# Patient Record
Sex: Female | Born: 1990 | Race: White | Hispanic: No | Marital: Single | Smoking: Never smoker
Health system: Southern US, Community
[De-identification: ages and names within clinical notes are randomized; demographics above are authoritative.]

## PROBLEM LIST (undated history)

## (undated) DIAGNOSIS — J45909 Unspecified asthma, uncomplicated: Secondary | ICD-10-CM

---
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
## 2015-07-25 ENCOUNTER — Encounter (HOSPITAL_COMMUNITY): Payer: Self-pay | Admitting: Emergency Medicine

## 2015-07-25 ENCOUNTER — Emergency Department (HOSPITAL_COMMUNITY): Payer: BLUE CROSS/BLUE SHIELD

## 2015-07-25 ENCOUNTER — Emergency Department (HOSPITAL_COMMUNITY)
Admission: EM | Admit: 2015-07-25 | Discharge: 2015-07-25 | Disposition: A | Discharge status: Home / Self Care | Payer: BLUE CROSS/BLUE SHIELD | Attending: Emergency Medicine | Admitting: Emergency Medicine

## 2015-07-25 DIAGNOSIS — S0003XA Contusion of scalp, initial encounter: Secondary | ICD-10-CM | POA: Diagnosis not present

## 2015-07-25 DIAGNOSIS — S199XXA Unspecified injury of neck, initial encounter: Secondary | ICD-10-CM | POA: Diagnosis present

## 2015-07-25 DIAGNOSIS — S134XXA Sprain of ligaments of cervical spine, initial encounter: Secondary | ICD-10-CM | POA: Diagnosis not present

## 2015-07-25 DIAGNOSIS — Y9389 Activity, other specified: Secondary | ICD-10-CM | POA: Diagnosis not present

## 2015-07-25 DIAGNOSIS — Z79899 Other long term (current) drug therapy: Secondary | ICD-10-CM | POA: Insufficient documentation

## 2015-07-25 DIAGNOSIS — Y92410 Unspecified street and highway as the place of occurrence of the external cause: Secondary | ICD-10-CM | POA: Insufficient documentation

## 2015-07-25 DIAGNOSIS — Y998 Other external cause status: Secondary | ICD-10-CM | POA: Insufficient documentation

## 2015-07-25 DIAGNOSIS — S139XXA Sprain of joints and ligaments of unspecified parts of neck, initial encounter: Secondary | ICD-10-CM

## 2015-07-25 DIAGNOSIS — S0001XA Abrasion of scalp, initial encounter: Secondary | ICD-10-CM | POA: Insufficient documentation

## 2015-07-25 MED ORDER — IBUPROFEN 800 MG PO TABS: 800.0000 mg | ORAL_TABLET | Freq: Three times a day (TID) | ORAL | Status: AC

## 2015-07-25 MED ORDER — CYCLOBENZAPRINE HCL 10 MG PO TABS: 10.0000 mg | ORAL_TABLET | Freq: Two times a day (BID) | ORAL | Status: AC | PRN
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
## 2015-07-25 NOTE — Discharge Instructions (Signed)
Cervical Sprain A cervical sprain is an injury in the neck in which the strong, fibrous tissues (ligaments) that connect your neck bones stretch or tear. Cervical sprains can range from mild to severe. Severe cervical sprains can cause the neck vertebrae to be unstable. This can lead to damage of the spinal cord and can result in serious nervous system problems. The amount of time it takes for a cervical sprain to get better depends on the cause and extent of the injury. Most cervical sprains heal in 1 to 3 weeks. CAUSES  Severe cervical sprains may be caused by:   Contact sport injuries (such as from football, rugby, wrestling, hockey, auto racing, gymnastics, diving, martial arts, or boxing).   Motor vehicle collisions.   Whiplash injuries. This is an injury from a sudden forward and backward whipping movement of the head and neck.  Falls.  Mild cervical sprains may be caused by:   Being in an awkward position, such as while cradling a telephone between your ear and shoulder.   Sitting in a chair that does not offer proper support.   Working at a poorly Landscape architect station.   Looking up or down for long periods of time.  SYMPTOMS   Pain, soreness, stiffness, or a burning sensation in the front, back, or sides of the neck. This discomfort may develop immediately after the injury or slowly, 24 hours or more after the injury.   Pain or tenderness directly in the middle of the back of the neck.   Shoulder or upper back pain.   Limited ability to move the neck.   Headache.   Dizziness.   Weakness, numbness, or tingling in the hands or arms.   Muscle spasms.   Difficulty swallowing or chewing.   Tenderness and swelling of the neck.  DIAGNOSIS  Most of the time your health care provider can diagnose a cervical sprain by taking your history and doing a physical exam. Your health care provider will ask about previous neck injuries and any known neck  problems, such as arthritis in the neck. X-rays may be taken to find out if there are any other problems, such as with the bones of the neck. Other tests, such as a CT scan or MRI, may also be needed.  TREATMENT  Treatment depends on the severity of the cervical sprain. Mild sprains can be treated with rest, keeping the neck in place (immobilization), and pain medicines. Severe cervical sprains are immediately immobilized. Further treatment is done to help with pain, muscle spasms, and other symptoms and may include:  Medicines, such as pain relievers, numbing medicines, or muscle relaxants.   Physical therapy. This may involve stretching exercises, strengthening exercises, and posture training. Exercises and improved posture can help stabilize the neck, strengthen muscles, and help stop symptoms from returning.  HOME CARE INSTRUCTIONS   Put ice on the injured area.   Put ice in a plastic bag.   Place a towel between your skin and the bag.   Leave the ice on for 15-20 minutes, 3-4 times a day.   If your injury was severe, you may have been given a cervical collar to wear. A cervical collar is a two-piece collar designed to keep your neck from moving while it heals.  Do not remove the collar unless instructed by your health care provider.  If you have long hair, keep it outside of the collar.  Ask your health care provider before making any adjustments to your collar. Minor  adjustments may be required over time to improve comfort and reduce pressure on your chin or on the back of your head.  Ifyou are allowed to remove the collar for cleaning or bathing, follow your health care provider's instructions on how to do so safely.  Keep your collar clean by wiping it with mild soap and water and drying it completely. If the collar you have been given includes removable pads, remove them every 1-2 days and hand wash them with soap and water. Allow them to air dry. They should be completely  dry before you wear them in the collar.  If you are allowed to remove the collar for cleaning and bathing, wash and dry the skin of your neck. Check your skin for irritation or sores. If you see any, tell your health care provider.  Do not drive while wearing the collar.   Only take over-the-counter or prescription medicines for pain, discomfort, or fever as directed by your health care provider.   Keep all follow-up appointments as directed by your health care provider.   Keep all physical therapy appointments as directed by your health care provider.   Make any needed adjustments to your workstation to promote good posture.   Avoid positions and activities that make your symptoms worse.   Warm up and stretch before being active to help prevent problems.  SEEK MEDICAL CARE IF:   Your pain is not controlled with medicine.   You are unable to decrease your pain medicine over time as planned.   Your activity level is not improving as expected.  SEEK IMMEDIATE MEDICAL CARE IF:   You develop any bleeding.  You develop stomach upset.  You have signs of an allergic reaction to your medicine.   Your symptoms get worse.   You develop new, unexplained symptoms.   You have numbness, tingling, weakness, or paralysis in any part of your body.  MAKE SURE YOU:   Understand these instructions.  Will watch your condition.  Will get help right away if you are not doing well or get worse.   This information is not intended to replace advice given to you by your health care provider. Make sure you discuss any questions you have with your health care provider.   Document Released: 04/11/2007 Document Revised: 06/19/2013 Document Reviewed: 12/20/2012 Elsevier Interactive Patient Education 2016 [employer] Cryotherapy Cryotherapy means treatment with cold. Ice or gel packs can be used to reduce both pain and swelling. Ice is the most helpful within the first 24 to 48  hours after an injury or flare-up from overusing a muscle or joint. Sprains, strains, spasms, burning pain, shooting pain, and aches can all be eased with ice. Ice can also be used when recovering from surgery. Ice is effective, has very few side effects, and is safe for most people to use. PRECAUTIONS  Ice is not a safe treatment option for people with:  Raynaud phenomenon. This is a condition affecting small blood vessels in the extremities. Exposure to cold may cause your problems to return.  Cold hypersensitivity. There are many forms of cold hypersensitivity, including:  Cold urticaria. Red, itchy hives appear on the skin when the tissues begin to warm after being iced.  Cold erythema. This is a red, itchy rash caused by exposure to cold.  Cold hemoglobinuria. Red blood cells break down when the tissues begin to warm after being iced. The hemoglobin that carry oxygen are passed into the urine because they cannot combine with blood  proteins fast enough.  Numbness or altered sensitivity in the area being iced. If you have any of the following conditions, do not use ice until you have discussed cryotherapy with your caregiver:  Heart conditions, such as arrhythmia, angina, or chronic heart disease.  High blood pressure.  Healing wounds or open skin in the area being iced.  Current infections.  Rheumatoid arthritis.  Poor circulation.  Diabetes. Ice slows the blood flow in the region it is applied. This is beneficial when trying to stop inflamed tissues from spreading irritating chemicals to surrounding tissues. However, if you expose your skin to cold temperatures for too long or without the proper protection, you can damage your skin or nerves. Watch for signs of skin damage due to cold. HOME CARE INSTRUCTIONS Follow these tips to use ice and cold packs safely.  Place a dry or damp towel between the ice and skin. A damp towel will cool the skin more quickly, so you may need to  shorten the time that the ice is used.  For a more rapid response, add gentle compression to the ice.  Ice for no more than 10 to 20 minutes at a time. The bonier the area you are icing, the less time it will take to get the benefits of ice.  Check your skin after 5 minutes to make sure there are no signs of a poor response to cold or skin damage.  Rest 20 minutes or more between uses.  Once your skin is numb, you can end your treatment. You can test numbness by very lightly touching your skin. The touch should be so light that you do not see the skin dimple from the pressure of your fingertip. When using ice, most people will feel these normal sensations in this order: cold, burning, aching, and numbness.  Do not use ice on someone who cannot communicate their responses to pain, such as small children or people with dementia. HOW TO MAKE AN ICE PACK Ice packs are the most common way to use ice therapy. Other methods include ice massage, ice baths, and cryosprays. Muscle creams that cause a cold, tingly feeling do not offer the same benefits that ice offers and should not be used as a substitute unless recommended by your caregiver. To make an ice pack, do one of the following:  Place crushed ice or a bag of frozen vegetables in a sealable plastic bag. Squeeze out the excess air. Place this bag inside another plastic bag. Slide the bag into a pillowcase or place a damp towel between your skin and the bag.  Mix 3 parts water with 1 part rubbing alcohol. Freeze the mixture in a sealable plastic bag. When you remove the mixture from the freezer, it will be slushy. Squeeze out the excess air. Place this bag inside another plastic bag. Slide the bag into a pillowcase or place a damp towel between your skin and the bag. SEEK MEDICAL CARE IF:  You develop white spots on your skin. This may give the skin a blotchy (mottled) appearance.  Your skin turns blue or pale.  Your skin becomes waxy or  hard.  Your swelling gets worse. MAKE SURE YOU:   Understand these instructions.  Will watch your condition.  Will get help right away if you are not doing well or get worse.   This information is not intended to replace advice given to you by your health care provider. Make sure you discuss any questions you have with your  health care provider.   Document Released: 02/08/2011 Document Revised: 07/05/2014 Document Reviewed: 02/08/2011 Elsevier Interactive Patient Education 2016 [employer]. Tourist information centre manager It is common to have multiple bruises and sore muscles after a motor vehicle collision (MVC). These tend to feel worse for the first 24 hours. You may have the most stiffness and soreness over the first several hours. You may also feel worse when you wake up the first morning after your collision. After this point, you will usually begin to improve with each day. The speed of improvement often depends on the severity of the collision, the number of injuries, and the location and nature of these injuries. HOME CARE INSTRUCTIONS  Put ice on the injured area.  Put ice in a plastic bag.  Place a towel between your skin and the bag.  Leave the ice on for 15-20 minutes, 3-4 times a day, or as directed by your health care provider.  Drink enough fluids to keep your urine clear or pale yellow. Do not drink alcohol.  Take a warm shower or bath once or twice a day. This will increase blood flow to sore muscles.  You may return to activities as directed by your caregiver. Be careful when lifting, as this may aggravate neck or back pain.  Only take over-the-counter or prescription medicines for pain, discomfort, or fever as directed by your caregiver. Do not use aspirin. This may increase bruising and bleeding. SEEK IMMEDIATE MEDICAL CARE IF:  You have numbness, tingling, or weakness in the arms or legs.  You develop severe headaches not relieved with medicine.  You have  severe neck pain, especially tenderness in the middle of the back of your neck.  You have changes in bowel or bladder control.  There is increasing pain in any area of the body.  You have shortness of breath, light-headedness, dizziness, or fainting.  You have chest pain.  You feel sick to your stomach (nauseous), throw up (vomit), or sweat.  You have increasing abdominal discomfort.  There is blood in your urine, stool, or vomit.  You have pain in your shoulder (shoulder strap areas).  You feel your symptoms are getting worse. MAKE SURE YOU:  Understand these instructions.  Will watch your condition.  Will get help right away if you are not doing well or get worse.   This information is not intended to replace advice given to you by your health care provider. Make sure you discuss any questions you have with your health care provider.   Document Released: 06/14/2005 Document Revised: 07/05/2014 Document Reviewed: 11/11/2010 Elsevier Interactive Patient Education [employer]

## 2018-02-06 ENCOUNTER — Emergency Department (HOSPITAL_COMMUNITY)
Admission: EM | Admit: 2018-02-06 | Discharge: 2018-02-06 | Discharge status: Against Medical Advice | Payer: BLUE CROSS/BLUE SHIELD | Attending: Emergency Medicine | Admitting: Emergency Medicine

## 2018-02-06 ENCOUNTER — Emergency Department (HOSPITAL_COMMUNITY): Payer: BLUE CROSS/BLUE SHIELD

## 2018-02-06 ENCOUNTER — Encounter (HOSPITAL_COMMUNITY): Payer: Self-pay | Admitting: *Deleted

## 2018-02-06 DIAGNOSIS — Z79899 Other long term (current) drug therapy: Secondary | ICD-10-CM | POA: Diagnosis not present

## 2018-02-06 DIAGNOSIS — R2689 Other abnormalities of gait and mobility: Secondary | ICD-10-CM | POA: Insufficient documentation

## 2018-02-06 DIAGNOSIS — R51 Headache: Secondary | ICD-10-CM | POA: Diagnosis not present

## 2018-02-06 DIAGNOSIS — Z5321 Procedure and treatment not carried out due to patient leaving prior to being seen by health care provider: Secondary | ICD-10-CM | POA: Insufficient documentation

## 2018-02-06 DIAGNOSIS — H538 Other visual disturbances: Secondary | ICD-10-CM | POA: Diagnosis not present

## 2018-02-06 DIAGNOSIS — J45909 Unspecified asthma, uncomplicated: Secondary | ICD-10-CM | POA: Diagnosis not present

## 2018-02-06 DIAGNOSIS — R519 Headache, unspecified: Secondary | ICD-10-CM

## 2018-02-06 DIAGNOSIS — R0789 Other chest pain: Secondary | ICD-10-CM | POA: Diagnosis not present

## 2018-02-06 HISTORY — DX: Unspecified asthma, uncomplicated: J45.909

## 2018-02-06 LAB — PREGNANCY, URINE: Preg Test, Ur: NEGATIVE

## 2018-02-06 MED ORDER — DEXAMETHASONE SODIUM PHOSPHATE 10 MG/ML IJ SOLN
10.0000 mg | Freq: Once | INTRAMUSCULAR | Status: AC
  Administered 2018-02-06: 10 mg via INTRAVENOUS
  Filled 2018-02-06: qty 1

## 2018-02-06 MED ORDER — METOCLOPRAMIDE HCL 5 MG/ML IJ SOLN
10.0000 mg | Freq: Once | INTRAMUSCULAR | Status: AC
  Administered 2018-02-06: 10 mg via INTRAVENOUS
  Filled 2018-02-06: qty 2

## 2018-02-06 NOTE — ED Notes (Signed)
Pt does not want the CT; MD made aware

## 2018-02-06 NOTE — ED Provider Notes (Signed)
[facility]   CSN: [id number] Arrival date & time: 02/06/18  1753     History   Chief Complaint Chief Complaint  Patient presents with  . Migraine  . Cough    HPI Michelle Cantrell is a 27 y.o. female with asthma who presents with 1 month of severe, constant, frontal headache.  She says that she has no history of headaches or migraines.  She reports taking Advil but says that this has provided minimal relief.  She reports feeling off balance at times.  She also says that her headaches are worse in the morning.  She also reports having some chest tightness, which she attributes to her asthma.  She says that she has been taking her inhaler, which has provided some relief.  She has no history of cancer.  She also reports having intermittent bilateral blurry vision.  HPI  Past Medical History:  Diagnosis Date  . Asthma     There are no active problems to display for this patient.   History reviewed. No pertinent surgical history.   OB History   None      Home Medications    Prior to Admission medications   Medication Sig Start Date End Date Taking? Authorizing Provider  cholecalciferol (VITAMIN D) 1000 units tablet Take 1,000 Units by mouth daily.    [provider]  cyclobenzaprine (FLEXERIL) 10 MG tablet Take 1 tablet (10 mg total) by mouth 2 (two) times daily as needed for muscle spasms. 07/25/15   Elpidio AnisUpstill, Shari, PA-C  ibuprofen (ADVIL,MOTRIN) 800 MG tablet Take 1 tablet (800 mg total) by mouth 3 (three) times daily. 07/25/15   Elpidio AnisUpstill, Shari, PA-C  Omega-3 Fatty Acids (FISH OIL PO) Take 1 capsule by mouth 2 (two) times daily.    [provider]    Family History History reviewed. No pertinent family history.  Social History Social History   Tobacco Use  . Smoking status: Never Smoker  . Smokeless tobacco: Never Used  Substance Use Topics  . Alcohol use: Not on file  . Drug use: Not on file      Allergies   Patient has no known allergies.   Review of Systems Review of Systems Review of Systems   Constitutional  Negative for fever  Negative for chills  HENT  Negative for ear pain  Negative for sore throat  Negative for difficultly swallowing  Eyes  Negative for eye pain  Negative for visual disturbance  Respiratory  +for shortness of breath  Negative for cough  CV  Negative for chest pain  Negative for leg swelling  Abdomen  Negative for abdominal pain  Negative for nausea  Negative for vomiting  MSK  Negative for extremity pain  Negative for back pain  Skin  Negative for rash  Negative for wound  Neuro  Negative for syncope  Negative for difficultly speaking  +headache  Psych  Negative for confusion   The remainder of the ROS was reviewed and negative except as documented above.      Physical Exam Updated Vital Signs BP 102/74   Pulse 74   Temp 98.4 F (36.9 C) (Oral)   Resp 16   LMP 02/06/2018   SpO2 100%   Physical Exam Physical Exam Constitutional  Nursing notes reviewed  Vital signs reviewed  HEENT  No obvious trauma  Supple without meningismus, mass, or overt JVD  EOMI  No scleral icterus or injection  Respiratory  Effort normal  CTAB  No respiratory distress  CV  Normal rate  No obvious murmurs  No pitting edema  Abdomen  Soft  Non-tender  Non-distended  No peritonitis  MSK  Atraumatic  No obvious deformity  ROM appropriate  Skin  Warm  Dry  Neuro Component Findings  Mental Status Exam Alert and oriented Memory appropriate  Cranial Nerves   CN II Visual fields intact to confrontation  CN III, IV, VI PERRL EOMI No nystagmus.   CN V Facial sensation is normal No weakness of masticatory muscles  CN VII No facial weakness or asymmetry  CN VIII Auditory acuity grossly normal  CN IX and X Uvula is midline Palate elevates symmetrically  CN XI  Normal sternocleidomastoid and  trapezius strength  CN XII The tongue is midline No tongue atrophy or fasciculations  Motor   Muscle Strength RUE: 5/5 flexion and extension RLE: 5/5 flexion and extension LUE: 5/5 flexion and extension LLE: 5/5 flexion and extension  No pronation or drift  Muscle Tone Normal bulk and tone  Coordination Intact finger-to-nose No tremor Negative Romberg  Sensation Intact to light touch  Gait Routine gait normal Mild difficultly with heel to toe walking        Psychiatric  Mood and affect normal        ED Treatments / Results  Labs (all labs ordered are listed, but only abnormal results are displayed) Labs Reviewed  PREGNANCY, URINE    EKG None  Radiology Dg Chest 2 View  Result Date: 02/06/2018 CLINICAL DATA:  Left lower chest pain EXAM: CHEST - 2 VIEW COMPARISON:  None. FINDINGS: The heart size and mediastinal contours are within normal limits. Both lungs are clear. The visualized skeletal structures are unremarkable. IMPRESSION: No active cardiopulmonary disease. Electronically Signed   By: Jasmine Pang M.D.   On: 02/06/2018 19:06    Procedures Procedures (including critical care time)  Medications Ordered in ED Medications  metoCLOPramide (REGLAN) injection 10 mg (10 mg Intravenous Given 02/06/18 2114)  dexamethasone (DECADRON) injection 10 mg (10 mg Intravenous Given 02/06/18 2114)     Initial Impression / Assessment and Plan / ED Course  I have reviewed the triage vital signs and the nursing notes.  Pertinent labs & imaging results that were available during my care of the patient were reviewed by me and considered in my medical decision making (see chart for details).     Michelle Cantrell presents due to 1 month of headache.  The differential for this is broad.  I see no signs of an acute infection.  I do not think that she has meningitis or encephalitis.  She has no current vision changes or conjunctival changes to suggest acute angle-closure glaucoma.   Given the nature of her headache, her saying that she feels off balance, and that her headaches are worse in the morning, I am concerned for a possible brain mass.  Idiopathic intracranial hypertension is also on the differential.  There has been no recent trauma to suggest an epidural, subdural, or intraparenchymal bleed.  She has no focal neurological deficits. She reports having some chest tightness, which she says is like her asthma.  She is currently CTAB.  I explained the work-up to the patient.  She was given Reglan and Decadron.  I also explained to her that we would like to obtain a CT scan.  The patient refused.  She said that she only wanted to go home.  She had decision-making capacity.  I explained to  her that I was concerned for a possible brain mass and that a CT scan was critical for helping with her evaluation.  She understood the risk and benefits of staying for further care and of being discharged.  She was able to rationally manipulate the information.  I believe that she has the capacity to refuse care.  She left AMA.  I provided ED return precautions.  Final Clinical Impressions(s) / ED Diagnoses   Final diagnoses:  Nonintractable headache, unspecified chronicity pattern, unspecified headache type    ED Discharge Orders    None       Talitha GivensAshburn, Nick, MD 02/07/18 0100    Margarita Grizzleay, Danielle, MD 02/09/18 1459

## 2018-02-06 NOTE — Discharge Instructions (Addendum)
You are leaving AMA.  We recommended further evaluation and a head Ct.  Please followup with your PCP.

## 2018-02-06 NOTE — ED Notes (Signed)
MD at bedside. 

## 2018-02-06 NOTE — ED Notes (Addendum)
Pt appears very anxious, attempted to encourage pt to stay longer, pt refusing. No signature pad available to sign out AMA; papercopy printed and explained to pt benefits and risks of leaving. papercopy signed by pt and witnessed signature by this RN, placed in medical records. Work note provided

## 2018-02-06 NOTE — ED Triage Notes (Signed)
Pt in c/o migraine for the last month, also a cough and increased use of her albuterol inhaler, no distress noted

## 2019-02-01 IMAGING — DX DG CHEST 2V
2 series · 2 of 2 positions shown · non-contrast
Comparison: None.

CLINICAL DATA: Left lower chest pain

EXAM:
CHEST - 2 VIEW

[w chest pa]
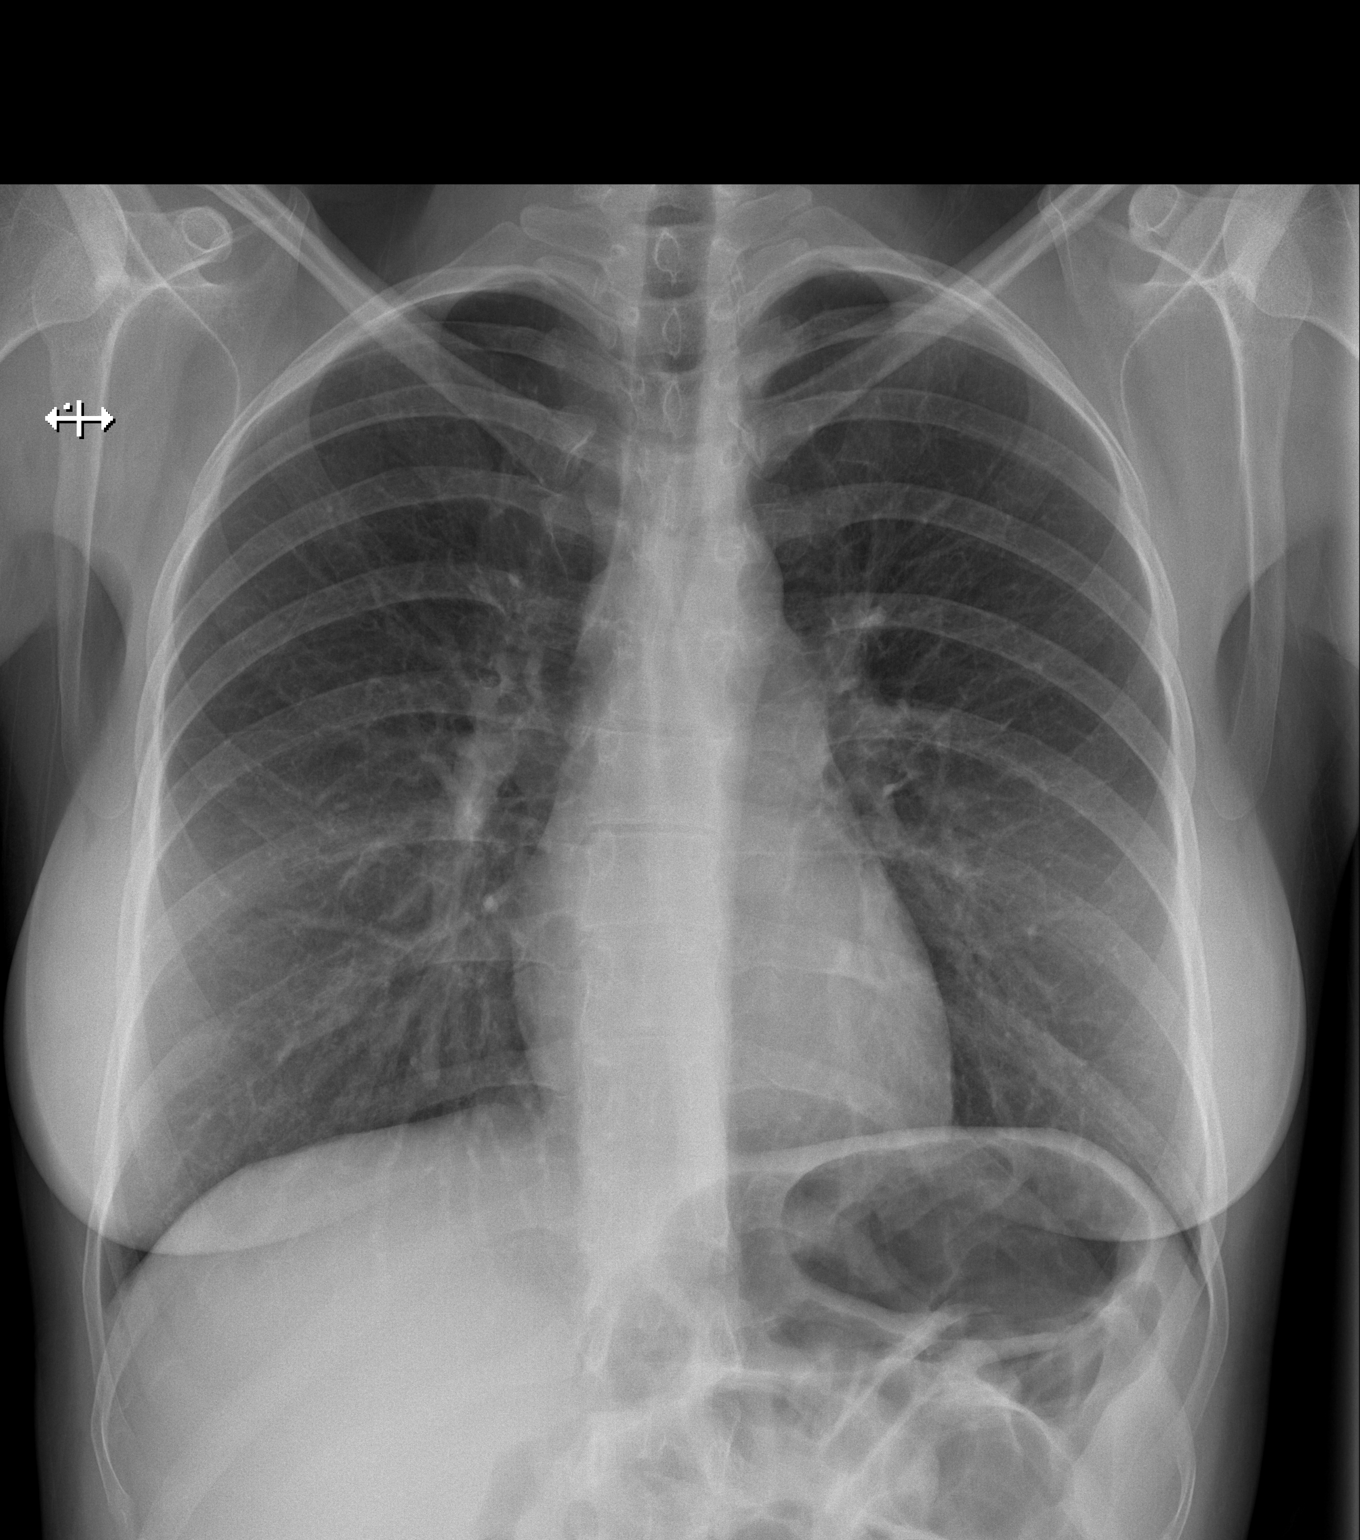

[w chest lat]
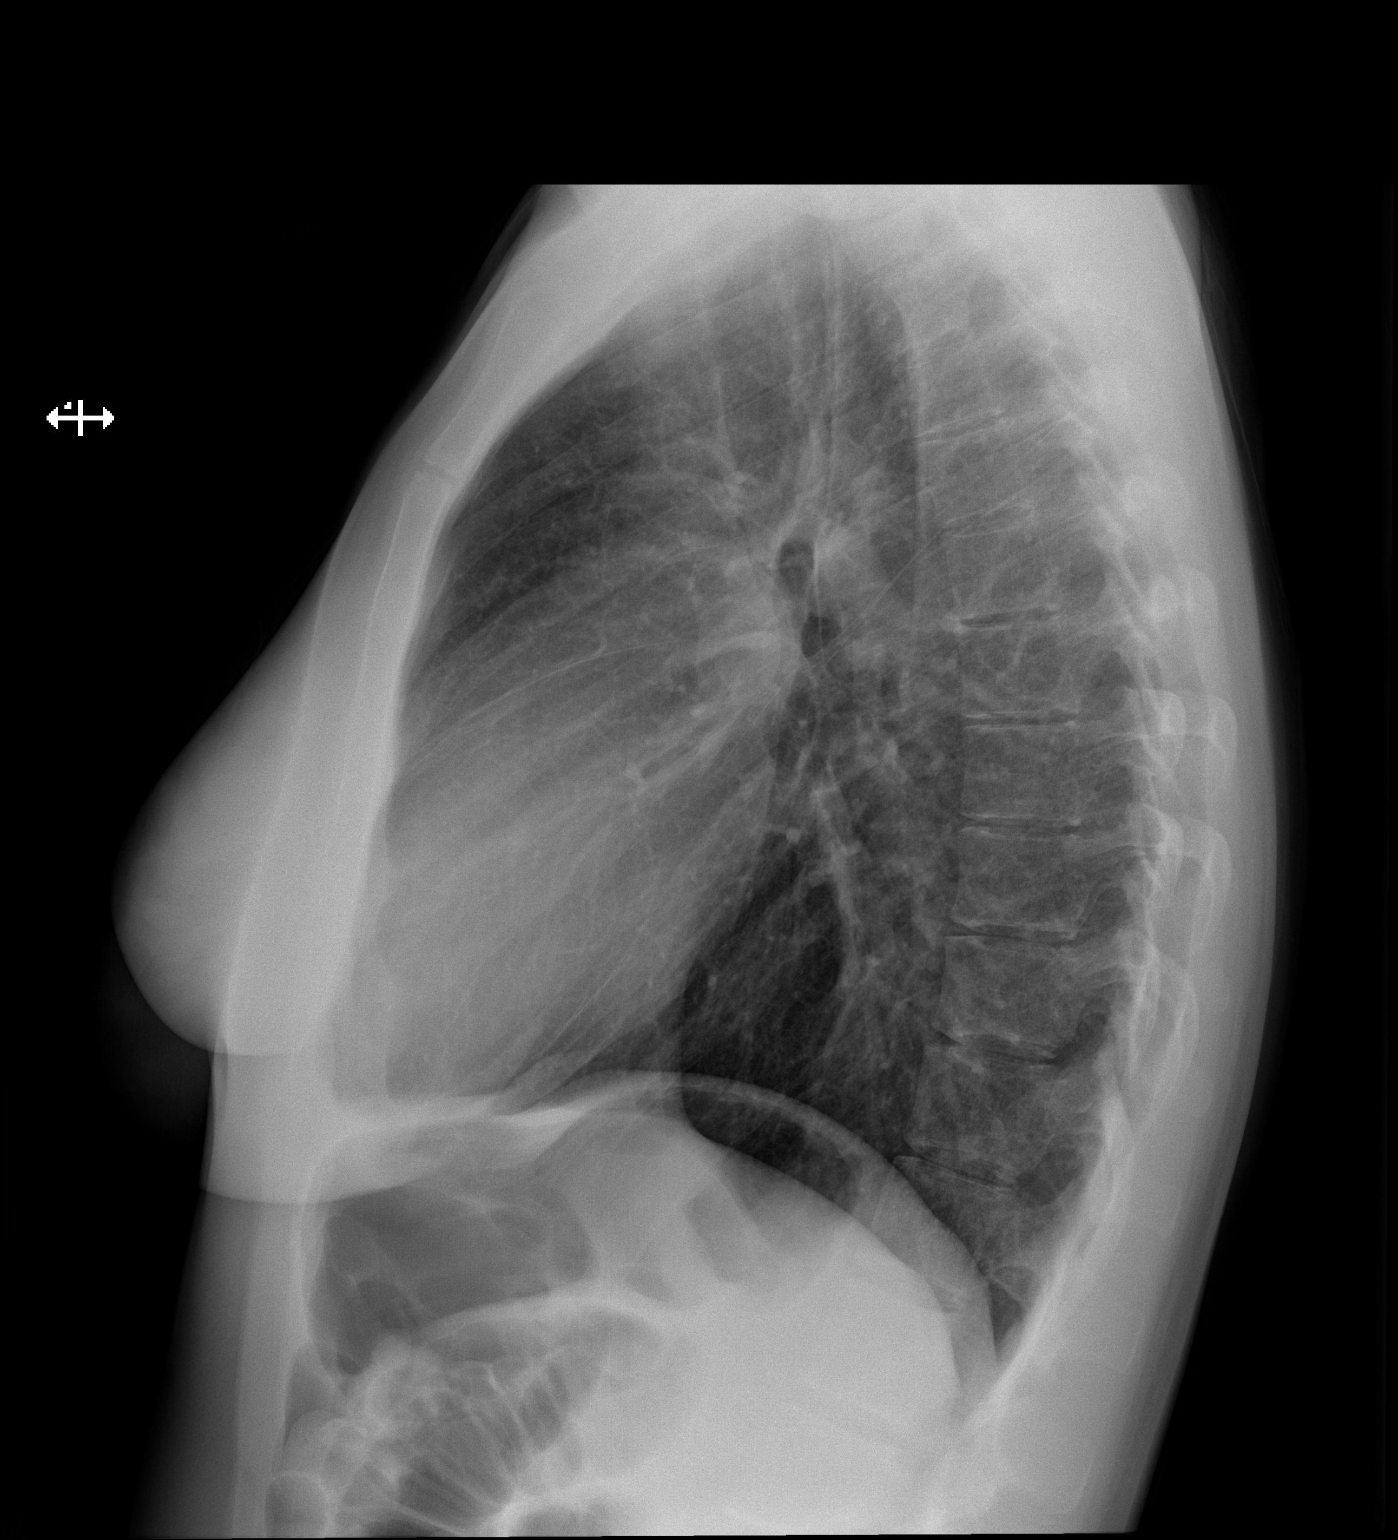

[2 of 2 positions shown; findings below may reference images not displayed]

FINDINGS: The heart size and mediastinal contours are within normal limits.
Both lungs are clear. The visualized skeletal structures are
unremarkable.
IMPRESSION: No active cardiopulmonary disease.

## 2019-04-20 ENCOUNTER — Encounter: Payer: Self-pay | Admitting: Podiatry

## 2019-04-20 ENCOUNTER — Other Ambulatory Visit: Payer: Self-pay

## 2019-04-20 ENCOUNTER — Ambulatory Visit: Payer: BC Managed Care – PPO | Admitting: Podiatry

## 2019-04-20 DIAGNOSIS — L600 Ingrowing nail: Secondary | ICD-10-CM

## 2019-04-20 DIAGNOSIS — M79672 Pain in left foot: Secondary | ICD-10-CM

## 2019-04-20 NOTE — Progress Notes (Signed)
  Subjective:  Patient ID: Michelle Cantrell, female    DOB: Jul 11, 1990,  MRN: [id number]  Chief Complaint  Patient presents with  . Nail Problem    Right 1st toenail is painful. Pt states 10 month duration, nail discoloration, and happened after pt got a pedicure.    28 y.o. female presents with the above complaint.  Agree with above statement.  The ingrown portion of the nail on the right medial side has been very painful in nature.  She states she works for [employer] and is constantly on her feet constantly putting pressure.   Review of Systems: Negative except as noted in the HPI. Denies N/V/F/Ch.  Past Medical History:  Diagnosis Date  . Asthma     Current Outpatient Medications:  .  cholecalciferol (VITAMIN D) 1000 units tablet, Take 1,000 Units by mouth daily., Disp: , Rfl:  .  cyclobenzaprine (FLEXERIL) 10 MG tablet, Take 1 tablet (10 mg total) by mouth 2 (two) times daily as needed for muscle spasms., Disp: 20 tablet, Rfl: 0 .  ibuprofen (ADVIL,MOTRIN) 800 MG tablet, Take 1 tablet (800 mg total) by mouth 3 (three) times daily., Disp: 21 tablet, Rfl: 0 .  Omega-3 Fatty Acids (FISH OIL PO), Take 1 capsule by mouth 2 (two) times daily., Disp: , Rfl:   Social History   Tobacco Use  Smoking Status Never Smoker  Smokeless Tobacco Never Used    No Known Allergies Objective:  There were no vitals filed for this visit. There is no height or weight on file to calculate BMI. Constitutional Well developed. Well nourished.  Vascular Dorsalis pedis pulses palpable bilaterally. Posterior tibial pulses palpable bilaterally. Capillary refill normal to all digits.  No cyanosis or clubbing noted. Pedal hair growth normal.  Neurologic Normal speech. Oriented to person, place, and time. Epicritic sensation to light touch grossly present bilaterally.  Dermatologic Painful ingrowing nail at medial nail borders of the hallux nail right. No other open wounds. No skin lesions.  Orthopedic:  Normal joint ROM without pain or crepitus bilaterally. No visible deformities. No bony tenderness.   Radiographs: None Assessment:   1. Ingrown toenail of left foot   2. Pain in left foot    Plan:  Patient was evaluated and treated and all questions answered.  Ingrown Nail, right -Patient elects to proceed with minor surgery to remove ingrown toenail removal today. Consent reviewed and signed by patient. -Ingrown nail excised. See procedure note. -Educated on post-procedure care including soaking. Written instructions provided and reviewed. -Patient to follow up in 2 weeks for nail check.  Procedure: Excision of Ingrown Toenail Location: Right 1st toe medial nail borders. Anesthesia: Lidocaine 1% plain; 1.5 mL and Marcaine 0.5% plain; 1.5 mL, digital block. Skin Prep: Betadine. Dressing: Silvadene; telfa; dry, sterile, compression dressing. Technique: Following skin prep, the toe was exsanguinated and a tourniquet was secured at the base of the toe. The affected nail border was freed, split with a nail splitter, and excised. Chemical matrixectomy was then performed with phenol and irrigated out with alcohol. The tourniquet was then removed and sterile dressing applied. Disposition: Patient tolerated procedure well. Patient to return in 2 weeks for follow-up.   No follow-ups on file.

## 2019-04-20 NOTE — Patient Instructions (Signed)

## 2019-05-11 ENCOUNTER — Ambulatory Visit: Payer: BC Managed Care – PPO | Admitting: Podiatry

## 2019-07-13 ENCOUNTER — Ambulatory Visit: Payer: BC Managed Care – PPO | Admitting: Podiatry

## 2019-10-05 ENCOUNTER — Ambulatory Visit: Payer: Self-pay | Attending: Internal Medicine

## 2019-10-05 DIAGNOSIS — Z23 Encounter for immunization: Secondary | ICD-10-CM

## 2019-10-05 NOTE — Progress Notes (Signed)
   Covid-19 Vaccination Clinic  Name:  Alanda Colton    MRN: [id number] DOB: 02/20/1991  10/05/2019  Ms. Udall was observed post Covid-19 immunization for 15 minutes without incident. She was provided with Vaccine Information Sheet and instruction to access the V-Safe system.   Ms. Russman was instructed to call 911 with any severe reactions post vaccine: Marland Kitchen Difficulty breathing  . Swelling of face and throat  . A fast heartbeat  . A bad rash all over body  . Dizziness and weakness   Immunizations Administered    Name Date Dose VIS Date Route   Pfizer COVID-19 Vaccine 10/05/2019  3:48 PM 0.3 mL 06/08/2019 Intramuscular   Manufacturer: [employer], [employer]   Lot: [id number]   NDC: [address]-3

## 2019-10-29 ENCOUNTER — Ambulatory Visit: Payer: Self-pay

## 2020-02-29 ENCOUNTER — Ambulatory Visit: Payer: BC Managed Care – PPO | Admitting: Podiatry

## 2020-09-24 ENCOUNTER — Encounter: Payer: Self-pay | Admitting: Podiatry

## 2020-09-24 ENCOUNTER — Ambulatory Visit: Payer: BC Managed Care – PPO | Admitting: Podiatry

## 2020-09-24 ENCOUNTER — Other Ambulatory Visit: Payer: Self-pay

## 2020-09-24 ENCOUNTER — Telehealth: Payer: Self-pay | Admitting: Podiatry

## 2020-09-24 ENCOUNTER — Ambulatory Visit (INDEPENDENT_AMBULATORY_CARE_PROVIDER_SITE_OTHER): Payer: BC Managed Care – PPO

## 2020-09-24 DIAGNOSIS — L603 Nail dystrophy: Secondary | ICD-10-CM | POA: Diagnosis not present

## 2020-09-24 DIAGNOSIS — L600 Ingrowing nail: Secondary | ICD-10-CM | POA: Diagnosis not present

## 2020-09-24 NOTE — Telephone Encounter (Signed)
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

## 2020-09-24 NOTE — Progress Notes (Signed)
  Subjective:  Patient ID: Michelle Cantrell, female    DOB: 06/03/1991,  MRN: [id number]  Chief Complaint  Patient presents with  . Foot Pain    Left foot pain under the toes. Possible ingrown     30 y.o. female presents with the above complaint.  Patient presents with complaint of left medial border ingrown.  Painful to touch.  She states that there is a lot of pain when ambulating.  She has not had a removed in the past.  She would like to discuss ingrown nail removal.  She has seen me about a year ago for the other side.  She denies any other acute complaints.  She would like to have it removed again.   Review of Systems: Negative except as noted in the HPI. Denies N/V/F/Ch.  Past Medical History:  Diagnosis Date  . Asthma     Current Outpatient Medications:  .  cholecalciferol (VITAMIN D) 1000 units tablet, Take 1,000 Units by mouth daily., Disp: , Rfl:  .  cyclobenzaprine (FLEXERIL) 10 MG tablet, Take 1 tablet (10 mg total) by mouth 2 (two) times daily as needed for muscle spasms., Disp: 20 tablet, Rfl: 0 .  ibuprofen (ADVIL,MOTRIN) 800 MG tablet, Take 1 tablet (800 mg total) by mouth 3 (three) times daily., Disp: 21 tablet, Rfl: 0 .  Omega-3 Fatty Acids (FISH OIL PO), Take 1 capsule by mouth 2 (two) times daily., Disp: , Rfl:   Social History   Tobacco Use  Smoking Status Never Smoker  Smokeless Tobacco Never Used    No Known Allergies Objective:  There were no vitals filed for this visit. There is no height or weight on file to calculate BMI. Constitutional Well developed. Well nourished.  Vascular Dorsalis pedis pulses palpable bilaterally. Posterior tibial pulses palpable bilaterally. Capillary refill normal to all digits.  No cyanosis or clubbing noted. Pedal hair growth normal.  Neurologic Normal speech. Oriented to person, place, and time. Epicritic sensation to light touch grossly present bilaterally.  Dermatologic Painful ingrowing nail at medial nail borders of  the hallux nail left. No other open wounds. No skin lesions.  Orthopedic: Normal joint ROM without pain or crepitus bilaterally. No visible deformities. No bony tenderness.   Radiographs: None Assessment:   1. Nail dystrophy   2. Ingrown toenail of left foot    Plan:  Patient was evaluated and treated and all questions answered.  Ingrown Nail, left with underlying nail dystrophy -Patient elects to proceed with minor surgery to remove ingrown toenail removal today. Consent reviewed and signed by patient. -Ingrown nail excised. See procedure note. -Educated on post-procedure care including soaking. Written instructions provided and reviewed. -Patient to follow up in 2 weeks for nail check.  Procedure: Excision of Ingrown Toenail Location: Left 1st toe medial nail borders. Anesthesia: Lidocaine 1% plain; 1.5 mL and Marcaine 0.5% plain; 1.5 mL, digital block. Skin Prep: Betadine. Dressing: Silvadene; telfa; dry, sterile, compression dressing. Technique: Following skin prep, the toe was exsanguinated and a tourniquet was secured at the base of the toe. The affected nail border was freed, split with a nail splitter, and excised. Chemical matrixectomy was then performed with phenol and irrigated out with alcohol. The tourniquet was then removed and sterile dressing applied. Disposition: Patient tolerated procedure well. Patient to return in 2 weeks for follow-up.   No follow-ups on file.

## 2020-09-24 NOTE — Telephone Encounter (Signed)
Ok will let patient know, thanks

## 2020-09-24 NOTE — Telephone Encounter (Signed)
Unfortunately, they do not come in closed toe. There is graphite plate you can put on them which can simulate closed toe if that works?

## 2020-10-08 ENCOUNTER — Encounter: Payer: Self-pay | Admitting: Podiatry

## 2020-10-08 ENCOUNTER — Ambulatory Visit (INDEPENDENT_AMBULATORY_CARE_PROVIDER_SITE_OTHER): Payer: BC Managed Care – PPO | Admitting: Podiatry

## 2020-10-08 ENCOUNTER — Other Ambulatory Visit: Payer: Self-pay

## 2020-10-08 DIAGNOSIS — L600 Ingrowing nail: Secondary | ICD-10-CM

## 2020-10-08 NOTE — Progress Notes (Signed)
Subjective: Michelle Cantrell is a 30 y.o.  female returns to office today for follow up evaluation after having left Hallux Medial border nail avulsion performed. Patient has been soaking using epsom salt and applying topical antibiotic covered with bandaid daily. Patient denies fevers, chills, nausea, vomiting. Denies any calf pain, chest pain, SOB.   Objective:  Vitals: Reviewed  General: Well developed, nourished, in no acute distress, alert and oriented x3   Dermatology: Skin is warm, dry and supple bilateral. Medial hallux nail border appears to be clean, dry, with mild granular tissue and surrounding scab. There is no surrounding erythema, edema, drainage/purulence. The remaining nails appear unremarkable at this time. There are no other lesions or other signs of infection present.  Neurovascular status: Intact. No lower extremity swelling; No pain with calf compression bilateral.  Musculoskeletal: Decreased tenderness to palpation of the Medial hallux nail fold(s). Muscular strength within normal limits bilateral.   Assesement and Plan: S/p partial nail avulsion, doing well.   -Continue soaking in epsom salts twice a day followed by antibiotic ointment and a band-aid as needed. Can leave uncovered at night. .  -If the area does not heal properply, call the office for follow-up appointment, or sooner if any problems arise.  -Monitor for any signs/symptoms of infection. Call the office immediately if any occur or go directly to the emergency room. Call with any questions/concerns.  Nicholes Rough, DPM

## 2021-05-14 ENCOUNTER — Ambulatory Visit: Payer: Self-pay | Admitting: Physician Assistant
